# Patient Record
Sex: Female | Born: 1960 | Race: White | Hispanic: No | Smoking: Current every day smoker
Health system: Southern US, Community
[De-identification: ages and names within clinical notes are randomized; demographics above are authoritative.]

## PROBLEM LIST (undated history)

## (undated) DIAGNOSIS — F32A Depression, unspecified: Secondary | ICD-10-CM

## (undated) DIAGNOSIS — F329 Major depressive disorder, single episode, unspecified: Secondary | ICD-10-CM

## (undated) DIAGNOSIS — F1910 Other psychoactive substance abuse, uncomplicated: Secondary | ICD-10-CM

## (undated) HISTORY — PX: APPENDECTOMY: SHX54

## (undated) HISTORY — PX: TUBAL LIGATION: SHX77

---

## 2010-02-24 ENCOUNTER — Emergency Department (HOSPITAL_BASED_OUTPATIENT_CLINIC_OR_DEPARTMENT_OTHER)
Admission: EM | Admit: 2010-02-24 | Discharge: 2010-02-25 | Payer: Self-pay | Source: Home / Self Care | Admitting: Emergency Medicine

## 2010-05-10 LAB — CBC
MCHC: 34 g/dL (ref 30.0–36.0)
MCV: 91.4 fL (ref 78.0–100.0)
Platelets: 228 10*3/uL (ref 150–400)
WBC: 6.2 10*3/uL (ref 4.0–10.5)

## 2010-05-10 LAB — DIFFERENTIAL
Basophils Absolute: 0 10*3/uL (ref 0.0–0.1)
Eosinophils Absolute: 0.4 10*3/uL (ref 0.0–0.7)
Lymphs Abs: 2.1 10*3/uL (ref 0.7–4.0)
Monocytes Absolute: 0.5 10*3/uL (ref 0.1–1.0)
Neutro Abs: 3.2 10*3/uL (ref 1.7–7.7)
Neutrophils Relative %: 52 % (ref 43–77)

## 2010-05-10 LAB — BASIC METABOLIC PANEL
Chloride: 105 mEq/L (ref 96–112)
GFR calc Af Amer: >60 mL/min (ref >60)
GFR calc non Af Amer: >60 mL/min (ref >60)
Potassium: 3.8 mEq/L (ref 3.5–5.1)
Sodium: 141 mEq/L (ref 135–145)

## 2012-04-20 ENCOUNTER — Encounter (HOSPITAL_BASED_OUTPATIENT_CLINIC_OR_DEPARTMENT_OTHER): Payer: Self-pay | Admitting: *Deleted

## 2012-04-20 ENCOUNTER — Emergency Department (HOSPITAL_BASED_OUTPATIENT_CLINIC_OR_DEPARTMENT_OTHER)
Admission: EM | Admit: 2012-04-20 | Discharge: 2012-04-20 | Disposition: A | Discharge status: Home / Self Care | Payer: Self-pay | Attending: Emergency Medicine | Admitting: Emergency Medicine

## 2012-04-20 DIAGNOSIS — Z79899 Other long term (current) drug therapy: Secondary | ICD-10-CM | POA: Insufficient documentation

## 2012-04-20 DIAGNOSIS — F329 Major depressive disorder, single episode, unspecified: Secondary | ICD-10-CM | POA: Insufficient documentation

## 2012-04-20 DIAGNOSIS — F1910 Other psychoactive substance abuse, uncomplicated: Secondary | ICD-10-CM | POA: Insufficient documentation

## 2012-04-20 DIAGNOSIS — K029 Dental caries, unspecified: Secondary | ICD-10-CM | POA: Insufficient documentation

## 2012-04-20 DIAGNOSIS — F17200 Nicotine dependence, unspecified, uncomplicated: Secondary | ICD-10-CM | POA: Insufficient documentation

## 2012-04-20 DIAGNOSIS — F3289 Other specified depressive episodes: Secondary | ICD-10-CM | POA: Insufficient documentation

## 2012-04-20 HISTORY — DX: Other psychoactive substance abuse, uncomplicated: F19.10

## 2012-04-20 HISTORY — DX: Depression, unspecified: F32.A

## 2012-04-20 HISTORY — DX: Major depressive disorder, single episode, unspecified: F32.9

## 2012-04-20 MED ORDER — AMOXICILLIN 500 MG PO CAPS: 1000.0000 mg | ORAL_CAPSULE | Freq: Two times a day (BID) | ORAL | Status: DC

## 2012-04-20 NOTE — ED Provider Notes (Signed)
History     CSN: [id number]  Arrival date & time 04/20/12  4098   None     Chief Complaint  Patient presents with  . Dental Pain    (Consider location/radiation/quality/duration/timing/severity/associated sxs/prior treatment) Patient is a 52 y.o. female presenting with tooth pain. The history is provided by the patient.  Dental Pain She has been having pain in her left upper teeth for the last 2 months. Pain is intermittent and only moderate in intensity. It is 4/5 at its worst and she is currently pain free. She is under treatment at [facility] For cocaine and alcohol abuse. She states that they will be arranging for a dentist to see her.  Past Medical History  Diagnosis Date  . Substance abuse   . Depression     History reviewed. No pertinent past surgical history.  History reviewed. No pertinent family history.  History  Substance Use Topics  . Smoking status: Current Every Day Smoker  . Smokeless tobacco: Not on file  . Alcohol Use: Not on file    OB History   Grav Para Term Preterm Abortions TAB SAB Ect Mult Living                  Review of Systems  All other systems reviewed and are negative.    Allergies  Review of patient's allergies indicates no known allergies.  Home Medications   Current Outpatient Rx  Name  Route  Sig  Dispense  Refill  . ARIPiprazole (ABILIFY) 10 MG tablet   Oral   Take 10 mg by mouth daily.         . sertraline (ZOLOFT) 50 MG tablet   Oral   Take 100 mg by mouth 2 (two) times daily.           BP 127/78  Pulse 89  Temp(Src) 98 F (36.7 C) (Oral)  Resp 14  Ht 5' 4.5" (1.638 m)  Wt 153 lb (69.4 kg)  BMI 25.87 kg/m2  SpO2 99%  Physical Exam  Nursing note and vitals reviewed.  52 year old female, resting comfortably and in no acute distress. Vital signs are normal. Oxygen saturation is 99%, which is normal. Head is normocephalic and atraumatic. PERRLA, EOMI. Oropharynx is clear.Teeth numbers 16 and 17 are very  carious and extremely loose. Multiple other teeth are missing.  Neck is nontender and supple without adenopathy or JVD. Back is nontender and there is no CVA tenderness. Lungs are clear without rales, wheezes, or rhonchi. Chest is nontender. Heart has regular rate and rhythm without murmur. Abdomen is soft, flat, nontender without masses or hepatosplenomegaly and peristalsis is normoactive. Extremities have no cyanosis or edema, full range of motion is present. Skin is warm and dry without rash. Neurologic: Mental status is normal, cranial nerves are intact, there are no motor or sensory deficits.  ED Course  Procedures (including critical care time)   1. Dental caries       MDM  Extensive caries in teeth numbers 16 and 17 with marked loosening of these teeth. I doubt if they are viable. She needs to see a dentist and she states that Floydene Flock is going to arrange for a dentist. In the meantime, she is discharged with a prescription for amoxicillin.        Dione Booze, MD 04/20/12 1036

## 2012-04-20 NOTE — ED Notes (Signed)
Pt amb to room 10 with quick steady gait smiling in nad. Pt reports tooth pain x 2 months. Sent here by [facility] for antibiotics, pt states they will then arrange for her to see a dentist. Pt denies any pain at this time, states she does have pain off and on.

## 2012-04-22 ENCOUNTER — Emergency Department (HOSPITAL_BASED_OUTPATIENT_CLINIC_OR_DEPARTMENT_OTHER): Payer: Self-pay

## 2012-04-22 ENCOUNTER — Observation Stay (HOSPITAL_BASED_OUTPATIENT_CLINIC_OR_DEPARTMENT_OTHER)
Admission: EM | Admit: 2012-04-22 | Discharge: 2012-04-23 | Payer: Self-pay | Attending: General Surgery | Admitting: General Surgery

## 2012-04-22 ENCOUNTER — Emergency Department (HOSPITAL_COMMUNITY): Payer: Self-pay | Admitting: *Deleted

## 2012-04-22 ENCOUNTER — Encounter (HOSPITAL_COMMUNITY): Payer: Self-pay | Admitting: *Deleted

## 2012-04-22 ENCOUNTER — Encounter (HOSPITAL_BASED_OUTPATIENT_CLINIC_OR_DEPARTMENT_OTHER): Payer: Self-pay | Admitting: *Deleted

## 2012-04-22 ENCOUNTER — Encounter (HOSPITAL_COMMUNITY)
Admission: EM | Disposition: A | Discharge status: Other Acute Inpatient Hospital | Payer: Self-pay | Source: Home / Self Care | Attending: Emergency Medicine

## 2012-04-22 DIAGNOSIS — A5901 Trichomonal vulvovaginitis: Secondary | ICD-10-CM | POA: Insufficient documentation

## 2012-04-22 DIAGNOSIS — F1910 Other psychoactive substance abuse, uncomplicated: Secondary | ICD-10-CM | POA: Diagnosis present

## 2012-04-22 DIAGNOSIS — F1010 Alcohol abuse, uncomplicated: Secondary | ICD-10-CM | POA: Insufficient documentation

## 2012-04-22 DIAGNOSIS — F1210 Cannabis abuse, uncomplicated: Secondary | ICD-10-CM | POA: Insufficient documentation

## 2012-04-22 DIAGNOSIS — K3580 Unspecified acute appendicitis: Principal | ICD-10-CM | POA: Diagnosis present

## 2012-04-22 DIAGNOSIS — J069 Acute upper respiratory infection, unspecified: Secondary | ICD-10-CM | POA: Insufficient documentation

## 2012-04-22 DIAGNOSIS — F17200 Nicotine dependence, unspecified, uncomplicated: Secondary | ICD-10-CM | POA: Insufficient documentation

## 2012-04-22 DIAGNOSIS — Z79899 Other long term (current) drug therapy: Secondary | ICD-10-CM | POA: Insufficient documentation

## 2012-04-22 HISTORY — PX: LAPAROSCOPIC APPENDECTOMY: SHX408

## 2012-04-22 LAB — COMPREHENSIVE METABOLIC PANEL
ALT: 26 U/L (ref 0–35)
AST: 24 U/L (ref 0–37)
Albumin: 3.9 g/dL (ref 3.5–5.2)
Alkaline Phosphatase: 117 U/L (ref 39–117)
CO2: 29 mEq/L (ref 19–32)
Creatinine, Ser: 0.6 mg/dL (ref 0.50–1.10)
GFR calc non Af Amer: >90 mL/min (ref >90)
Total Bilirubin: 0.3 mg/dL (ref 0.3–1.2)
Total Protein: 7.6 g/dL (ref 6.0–8.3)

## 2012-04-22 LAB — CBC WITH DIFFERENTIAL/PLATELET
Basophils Relative: 0 % (ref 0–1)
Eosinophils Absolute: 0.3 10*3/uL (ref 0.0–0.7)
Hemoglobin: 14 g/dL (ref 12.0–15.0)
Lymphocytes Relative: 12 % (ref 12–46)
Lymphs Abs: 1.3 10*3/uL (ref 0.7–4.0)
MCH: 31.4 pg (ref 26.0–34.0)
MCHC: 33.6 g/dL (ref 30.0–36.0)
Monocytes Absolute: 0.9 10*3/uL (ref 0.1–1.0)
Neutro Abs: 8.6 10*3/uL — ABNORMAL HIGH (ref 1.7–7.7)
Platelets: 286 10*3/uL (ref 150–400)

## 2012-04-22 LAB — URINALYSIS, ROUTINE W REFLEX MICROSCOPIC
Bilirubin Urine: NEGATIVE
Hgb urine dipstick: NEGATIVE
Ketones, ur: NEGATIVE mg/dL

## 2012-04-22 LAB — URINE MICROSCOPIC-ADD ON

## 2012-04-22 SURGERY — APPENDECTOMY, LAPAROSCOPIC
Anesthesia: General | Site: Abdomen | Wound class: Clean Contaminated

## 2012-04-22 MED ORDER — ONDANSETRON HCL 4 MG PO TABS: 4.0000 mg | ORAL_TABLET | Freq: Four times a day (QID) | ORAL | Status: DC | PRN

## 2012-04-22 MED ORDER — MORPHINE SULFATE 2 MG/ML IJ SOLN: 2.0000 mg | INTRAMUSCULAR | Status: DC | PRN

## 2012-04-22 MED ORDER — LIDOCAINE HCL (CARDIAC) 20 MG/ML IV SOLN
INTRAVENOUS | Status: DC | PRN
  Administered 2012-04-22: 75 mg via INTRAVENOUS

## 2012-04-22 MED ORDER — SUCCINYLCHOLINE CHLORIDE 20 MG/ML IJ SOLN
INTRAMUSCULAR | Status: DC | PRN
  Administered 2012-04-22: 100 mg via INTRAVENOUS

## 2012-04-22 MED ORDER — ALBUTEROL SULFATE HFA 108 (90 BASE) MCG/ACT IN AERS
INHALATION_SPRAY | RESPIRATORY_TRACT | Status: AC
  Filled 2012-04-22: qty 6.7

## 2012-04-22 MED ORDER — MIDAZOLAM HCL 5 MG/5ML IJ SOLN
INTRAMUSCULAR | Status: DC | PRN
  Administered 2012-04-22: 0.5 mg via INTRAVENOUS
  Administered 2012-04-22: .5 mg via INTRAVENOUS
  Administered 2012-04-22: 1 mg via INTRAVENOUS

## 2012-04-22 MED ORDER — ACETAMINOPHEN 10 MG/ML IV SOLN
INTRAVENOUS | Status: AC
  Filled 2012-04-22: qty 100

## 2012-04-22 MED ORDER — SERTRALINE HCL 100 MG PO TABS
200.0000 mg | ORAL_TABLET | Freq: Every day | ORAL | Status: DC
  Administered 2012-04-22 – 2012-04-23 (×2): 200 mg via ORAL
  Filled 2012-04-22 (×2): qty 2

## 2012-04-22 MED ORDER — FENTANYL CITRATE 0.05 MG/ML IJ SOLN
INTRAMUSCULAR | Status: DC | PRN
  Administered 2012-04-22: 100 ug via INTRAVENOUS
  Administered 2012-04-22: 150 ug via INTRAVENOUS

## 2012-04-22 MED ORDER — ESMOLOL HCL 10 MG/ML IV SOLN
INTRAVENOUS | Status: DC | PRN
  Administered 2012-04-22: 20 mg via INTRAVENOUS
  Administered 2012-04-22: 10 mg via INTRAVENOUS

## 2012-04-22 MED ORDER — SODIUM CHLORIDE 0.9 % IV BOLUS (SEPSIS)
1000.0000 mL | Freq: Once | INTRAVENOUS | Status: AC
  Administered 2012-04-22: 1000 mL via INTRAVENOUS

## 2012-04-22 MED ORDER — NEOSTIGMINE METHYLSULFATE 1 MG/ML IJ SOLN
INTRAMUSCULAR | Status: DC | PRN
  Administered 2012-04-22: 1 mg via INTRAVENOUS

## 2012-04-22 MED ORDER — INFLUENZA VIRUS VACC SPLIT PF IM SUSP
0.5000 mL | INTRAMUSCULAR | Status: DC
  Filled 2012-04-22 (×2): qty 0.5

## 2012-04-22 MED ORDER — LACTATED RINGERS IV SOLN
INTRAVENOUS | Status: DC | PRN
  Administered 2012-04-22 (×2): via INTRAVENOUS

## 2012-04-22 MED ORDER — GUAIFENESIN 100 MG/5ML PO SOLN
200.0000 mg | Freq: Three times a day (TID) | ORAL | Status: DC | PRN
  Filled 2012-04-22: qty 118

## 2012-04-22 MED ORDER — PHENYLEPHRINE HCL 10 MG/ML IJ SOLN
INTRAMUSCULAR | Status: DC | PRN
  Administered 2012-04-22: 20 ug via INTRAVENOUS

## 2012-04-22 MED ORDER — KETOROLAC TROMETHAMINE 30 MG/ML IJ SOLN
INTRAMUSCULAR | Status: DC | PRN
  Administered 2012-04-22: 30 mg via INTRAVENOUS

## 2012-04-22 MED ORDER — METRONIDAZOLE 500 MG PO TABS
500.0000 mg | ORAL_TABLET | Freq: Three times a day (TID) | ORAL | Status: DC
  Administered 2012-04-22 – 2012-04-23 (×2): 500 mg via ORAL
  Filled 2012-04-22 (×7): qty 1

## 2012-04-22 MED ORDER — SODIUM CHLORIDE 0.9 % IV SOLN
3.0000 g | INTRAVENOUS | Status: DC | PRN
  Administered 2012-04-22: 3 g via INTRAVENOUS

## 2012-04-22 MED ORDER — LACTATED RINGERS IR SOLN
Status: DC | PRN
  Administered 2012-04-22: 1

## 2012-04-22 MED ORDER — PROPOFOL 10 MG/ML IV EMUL
INTRAVENOUS | Status: DC | PRN
  Administered 2012-04-22: 175 mg via INTRAVENOUS

## 2012-04-22 MED ORDER — IBUPROFEN 600 MG PO TABS
600.0000 mg | ORAL_TABLET | Freq: Four times a day (QID) | ORAL | Status: DC | PRN
  Administered 2012-04-22 – 2012-04-23 (×2): 600 mg via ORAL
  Filled 2012-04-22 (×2): qty 1

## 2012-04-22 MED ORDER — IOHEXOL 300 MG/ML  SOLN
50.0000 mL | Freq: Once | INTRAMUSCULAR | Status: AC | PRN
  Administered 2012-04-22: 50 mL via ORAL

## 2012-04-22 MED ORDER — LACTATED RINGERS IV SOLN: INTRAVENOUS | Status: DC

## 2012-04-22 MED ORDER — HYDROCODONE-ACETAMINOPHEN 5-325 MG PO TABS: 1.0000 | ORAL_TABLET | ORAL | Status: DC | PRN

## 2012-04-22 MED ORDER — DM-GUAIFENESIN ER 30-600 MG PO TB12
1.0000 | ORAL_TABLET | Freq: Two times a day (BID) | ORAL | Status: DC
  Administered 2012-04-22 – 2012-04-23 (×3): 1 via ORAL
  Filled 2012-04-22 (×4): qty 1

## 2012-04-22 MED ORDER — ONDANSETRON HCL 4 MG/2ML IJ SOLN: 4.0000 mg | Freq: Four times a day (QID) | INTRAMUSCULAR | Status: DC | PRN

## 2012-04-22 MED ORDER — PROMETHAZINE HCL 25 MG/ML IJ SOLN: 6.2500 mg | INTRAMUSCULAR | Status: DC | PRN

## 2012-04-22 MED ORDER — ONDANSETRON HCL 4 MG/2ML IJ SOLN
INTRAMUSCULAR | Status: DC | PRN
  Administered 2012-04-22 (×2): 2 mg via INTRAVENOUS

## 2012-04-22 MED ORDER — ACETAMINOPHEN 10 MG/ML IV SOLN
INTRAVENOUS | Status: DC | PRN
  Administered 2012-04-22: 1000 mg via INTRAVENOUS

## 2012-04-22 MED ORDER — PNEUMOCOCCAL VAC POLYVALENT 25 MCG/0.5ML IJ INJ
0.5000 mL | INJECTION | INTRAMUSCULAR | Status: DC
  Filled 2012-04-22 (×2): qty 0.5

## 2012-04-22 MED ORDER — SODIUM CHLORIDE 0.9 % IV SOLN
3.0000 g | Freq: Once | INTRAVENOUS | Status: AC
  Administered 2012-04-22: 3 g via INTRAVENOUS
  Filled 2012-04-22: qty 3

## 2012-04-22 MED ORDER — BUPIVACAINE HCL (PF) 0.5 % IJ SOLN
INTRAMUSCULAR | Status: AC
  Filled 2012-04-22: qty 30

## 2012-04-22 MED ORDER — HYDROMORPHONE HCL PF 1 MG/ML IJ SOLN: 0.2500 mg | INTRAMUSCULAR | Status: DC | PRN

## 2012-04-22 MED ORDER — IOHEXOL 300 MG/ML  SOLN
100.0000 mL | Freq: Once | INTRAMUSCULAR | Status: AC | PRN
  Administered 2012-04-22: 100 mL via INTRAVENOUS

## 2012-04-22 MED ORDER — DEXAMETHASONE SODIUM PHOSPHATE 10 MG/ML IJ SOLN
INTRAMUSCULAR | Status: DC | PRN
  Administered 2012-04-22: 10 mg via INTRAVENOUS

## 2012-04-22 MED ORDER — SODIUM CHLORIDE 0.9 % IV SOLN
3.0000 g | Freq: Four times a day (QID) | INTRAVENOUS | Status: AC
  Administered 2012-04-22 – 2012-04-23 (×3): 3 g via INTRAVENOUS
  Filled 2012-04-22 (×3): qty 3

## 2012-04-22 MED ORDER — CISATRACURIUM BESYLATE (PF) 10 MG/5ML IV SOLN
INTRAVENOUS | Status: DC | PRN
  Administered 2012-04-22: 6 mg via INTRAVENOUS

## 2012-04-22 MED ORDER — EPHEDRINE SULFATE 50 MG/ML IJ SOLN
INTRAMUSCULAR | Status: DC | PRN
  Administered 2012-04-22: 5 mg via INTRAVENOUS

## 2012-04-22 MED ORDER — KCL-LACTATED RINGERS-D5W 20 MEQ/L IV SOLN
INTRAVENOUS | Status: DC
  Administered 2012-04-22: 22:00:00 via INTRAVENOUS
  Administered 2012-04-22: 125 mL/h via INTRAVENOUS
  Filled 2012-04-22 (×8): qty 1000

## 2012-04-22 MED ORDER — GLYCOPYRROLATE 0.2 MG/ML IJ SOLN
INTRAMUSCULAR | Status: DC | PRN
  Administered 2012-04-22: 0.2 mg via INTRAVENOUS

## 2012-04-22 MED ORDER — ARIPIPRAZOLE 10 MG PO TABS
10.0000 mg | ORAL_TABLET | Freq: Every day | ORAL | Status: DC
  Administered 2012-04-22 – 2012-04-23 (×2): 10 mg via ORAL
  Filled 2012-04-22 (×2): qty 1

## 2012-04-22 MED ORDER — BUPIVACAINE HCL (PF) 0.5 % IJ SOLN
INTRAMUSCULAR | Status: DC | PRN
  Administered 2012-04-22: 15 mL

## 2012-04-22 SURGICAL SUPPLY — 44 items
APPLIER CLIP 5 13 M/L LIGAMAX5 (MISCELLANEOUS) ×2
APPLIER CLIP ROT 10 11.4 M/L (STAPLE)
BENZOIN TINCTURE PRP APPL 2/3 (GAUZE/BANDAGES/DRESSINGS) ×2 IMPLANT
CANISTER SUCTION 2500CC (MISCELLANEOUS) ×2 IMPLANT
CHLORAPREP W/TINT 26ML (MISCELLANEOUS) ×2 IMPLANT
CLIP APPLIE 5 13 M/L LIGAMAX5 (MISCELLANEOUS) ×1 IMPLANT
CLIP APPLIE ROT 10 11.4 M/L (STAPLE) IMPLANT
CLOTH BEACON ORANGE TIMEOUT ST (SAFETY) ×2 IMPLANT
CLSR STERI-STRIP ANTIMIC 1/2X4 (GAUZE/BANDAGES/DRESSINGS) ×2 IMPLANT
CUTTER FLEX LINEAR 45M (STAPLE) ×2 IMPLANT
DECANTER SPIKE VIAL GLASS SM (MISCELLANEOUS) ×2 IMPLANT
DRAPE LAPAROSCOPIC ABDOMINAL (DRAPES) ×2 IMPLANT
DRAPE UTILITY XL STRL (DRAPES) ×2 IMPLANT
DRSG TEGADERM 2-3/8X2-3/4 SM (GAUZE/BANDAGES/DRESSINGS) ×2 IMPLANT
DRSG TEGADERM 4X4.75 (GAUZE/BANDAGES/DRESSINGS) ×2 IMPLANT
ELECT REM PT RETURN 9FT ADLT (ELECTROSURGICAL) ×2
ELECTRODE REM PT RTRN 9FT ADLT (ELECTROSURGICAL) ×1 IMPLANT
ENDOLOOP SUT PDS II  0 18 (SUTURE)
ENDOLOOP SUT PDS II 0 18 (SUTURE) IMPLANT
GAUZE SPONGE 2X2 8PLY STRL LF (GAUZE/BANDAGES/DRESSINGS) ×1 IMPLANT
GLOVE BIOGEL PI IND STRL 7.0 (GLOVE) ×1 IMPLANT
GLOVE BIOGEL PI INDICATOR 7.0 (GLOVE) ×1
GLOVE ECLIPSE 8.0 STRL XLNG CF (GLOVE) ×2 IMPLANT
GLOVE INDICATOR 8.0 STRL GRN (GLOVE) ×4 IMPLANT
GOWN STRL NON-REIN LRG LVL3 (GOWN DISPOSABLE) ×2 IMPLANT
GOWN STRL REIN XL XLG (GOWN DISPOSABLE) ×4 IMPLANT
KIT BASIN OR (CUSTOM PROCEDURE TRAY) ×2 IMPLANT
PENCIL BUTTON HOLSTER BLD 10FT (ELECTRODE) ×2 IMPLANT
POUCH SPECIMEN RETRIEVAL 10MM (ENDOMECHANICALS) ×2 IMPLANT
RELOAD 45 VASCULAR/THIN (ENDOMECHANICALS) IMPLANT
RELOAD STAPLE TA45 3.5 REG BLU (ENDOMECHANICALS) ×2 IMPLANT
SCALPEL HARMONIC ACE (MISCELLANEOUS) ×2 IMPLANT
SET IRRIG TUBING LAPAROSCOPIC (IRRIGATION / IRRIGATOR) ×2 IMPLANT
SOLUTION ANTI FOG 6CC (MISCELLANEOUS) ×2 IMPLANT
SPONGE GAUZE 2X2 STER 10/PKG (GAUZE/BANDAGES/DRESSINGS) ×1
STRIP CLOSURE SKIN 1/2X4 (GAUZE/BANDAGES/DRESSINGS) ×2 IMPLANT
SUT MNCRL AB 4-0 PS2 18 (SUTURE) ×2 IMPLANT
TOWEL OR 17X26 10 PK STRL BLUE (TOWEL DISPOSABLE) ×2 IMPLANT
TOWEL OR NON WOVEN STRL DISP B (DISPOSABLE) ×2 IMPLANT
TRAY FOLEY CATH 14FRSI W/METER (CATHETERS) ×2 IMPLANT
TRAY LAP CHOLE (CUSTOM PROCEDURE TRAY) ×2 IMPLANT
TROCAR BLADELESS OPT 5 75 (ENDOMECHANICALS) ×4 IMPLANT
TROCAR XCEL BLUNT TIP 100MML (ENDOMECHANICALS) ×2 IMPLANT
TUBING INSUFFLATION 10FT LAP (TUBING) ×2 IMPLANT

## 2012-04-22 NOTE — Op Note (Signed)
Operative Note  Laurie Browning female 52 y.o. 04/22/2012  PREOPERATIVE DX:  Acute appendicitis  POSTOPERATIVE DX:  Same(Retrocecal)  PROCEDURE:  Laparoscopic appendectomy         Surgeon: Adolph Pollack   Assistants: None  Anesthesia: General endotracheal anesthesia  Indications:   This is a 52 year old female in drug and alcohol rehabilitation who developed right-sided abdominal pain that worsened and persisted. She was evaluated and found to have acute appendicitis. She is now brought to the operating room for appendectomy.    Procedure Detail:  She was brought to the operating room placed supine on the operating table and general anesthetic was given. A Foley catheter was inserted. Some of the hair in the pubic area was clipped. The abdominal wall and upper pelvis areas were sterilely prepped and draped.  Marcaine solution was infiltrated in the subcutaneous tissues in the subumbilical region. A small subumbilical incision was made through the skin, subcutaneous tissue, fascia, and peritoneum entering the peritoneal cavity under direct vision. A pursestring suture of 0 Vicryl was placed around the edges of the fascia. A Hassan trocar was introduced into the peritoneal cavity and a pneumoperitoneum was created by insufflation of CO2 gas. The laparoscope was introduced in the area underneath the trocar was inspected. There was no evidence of organ injury or bleeding.  A 5 mm trocar was placed in the left lower quadrant. A 5 mm trocar was placed in the right upper quadrant. She was placed in the Trendelenburg position with right side tilted slightly up. The small bowel was mobilized free from the right lower quadrant. An acutely inflamed retrocecal appendicitis was noted. There is no evidence of abscess, necrosis, or perforation. The appendix was then mobilized using blunt dissection and the harmonic scalpel. It was then able to be retracted anteriorly. The mesoappendix was divided with  the harmonic scalpel down to the base.Using the Endo GIA stapler, the appendix and a small cuff of cecum were removed.  The specimen was then placed in a retrieval bag and removed through the subumbilical port. The subumbilical trocar was replaced.  The area was then copiously irrigated with saline solution. The staple line was inspected and there is no evidence of bleeding or leak. The irrigation fluid was evacuated. The subumbilical trocar was removed after a four-quadrant inspection. The subumbilical fascial defect was closed under laparoscopic vision by tying down the purse string suture. The remaining trocars were removed and the CO2 gas released.  Skin incisions were closed with 4-0 Monocryl subcuticular stitches. Steri-Strips and sterile dressings were applied.  She tolerated the procedure well without any apparent complications and was taken to the recovery room in satisfactory condition.  Estimated Blood Loss:  100 ml         Drains: none  Blood Given: none          Specimens: Appendix        Complications:  * No complications entered in OR log *         Disposition: PACU - hemodynamically stable.         Condition: stable

## 2012-04-22 NOTE — Transfer of Care (Signed)
Immediate Anesthesia Transfer of Care Note  Patient: Laurie Browning  Procedure(s) Performed: Procedure(s): APPENDECTOMY LAPAROSCOPIC (N/A)  Patient Location: PACU  Anesthesia Type:General  Level of Consciousness: awake, patient cooperative, confused, lethargic and responds to stimulation  Airway & Oxygen Therapy: Patient Spontanous Breathing and Patient connected to face mask oxygen  Post-op Assessment: Report given to PACU RN, Post -op Vital signs reviewed and stable and Patient moving all extremities  Post vital signs: Reviewed and stable  Complications: No apparent anesthesia complications

## 2012-04-22 NOTE — ED Notes (Signed)
Patient arrived via carelink. Per Care link yesterday patient had right upper quad abd pain. Took ibuprofen last night which helped. Pain was still there this am. Laurie [facility]. CT showed appendicitis. Tender to palpation.  141/82, 78, 16, 98.1 Hx of drug abuse.

## 2012-04-22 NOTE — ED Provider Notes (Signed)
History     CSN: [id number]  Arrival date & time 04/22/12  0808   First MD Initiated Contact with Patient 04/22/12 [phone]      Chief Complaint  Patient presents with  . Abdominal Pain    (Consider location/radiation/quality/duration/timing/severity/associated sxs/prior treatment) HPI Comments: Patient in treatment at [facility].  Started yesterday afternoon with discomfort in the right lower abdomen.  She has had an ovarian cyst in the past and this feels similar.  No fevers or chills.  Patient is a 52 y.o. female presenting with abdominal pain. The history is provided by the patient.  Abdominal Pain Pain location:  RLQ Pain quality: stabbing   Pain radiates to:  Does not radiate Pain severity:  Moderate Onset quality:  Gradual Timing:  Constant Progression:  Worsening Chronicity:  New Context: not sick contacts, not suspicious food intake and not trauma   Relieved by:  Nothing Worsened by:  Movement and palpation Ineffective treatments:  None tried Associated symptoms: nausea   Associated symptoms: no hematuria, no vaginal bleeding and no vaginal discharge     Past Medical History  Diagnosis Date  . Substance abuse   . Depression     Past Surgical History  Procedure Laterality Date  . Tubal ligation      No family history on file.  History  Substance Use Topics  . Smoking status: Current Every Day Smoker  . Smokeless tobacco: Not on file  . Alcohol Use: No    OB History   Grav Para Term Preterm Abortions TAB SAB Ect Mult Living                  Review of Systems  Gastrointestinal: Positive for nausea and abdominal pain.  Genitourinary: Negative for hematuria, vaginal bleeding and vaginal discharge.  All other systems reviewed and are negative.    Allergies  Review of patient's allergies indicates no known allergies.  Home Medications   Current Outpatient Rx  Name  Route  Sig  Dispense  Refill  . amoxicillin (AMOXIL) 500 MG capsule   Oral   Take  2 capsules (1,000 mg total) by mouth 2 (two) times daily.   40 capsule   0   . ARIPiprazole (ABILIFY) 10 MG tablet   Oral   Take 10 mg by mouth daily.         . sertraline (ZOLOFT) 50 MG tablet   Oral   Take 100 mg by mouth 2 (two) times daily.           BP 139/89  Pulse 81  Temp(Src) 98.3 F (36.8 C) (Oral)  Resp 18  Ht 5\' 4"  (1.626 m)  Wt 153 lb (69.4 kg)  BMI 26.25 kg/m2  SpO2 99%  Physical Exam  Nursing note and vitals reviewed. Constitutional: She is oriented to person, place, and time. She appears well-developed and well-nourished. No distress.  HENT:  Head: Normocephalic and atraumatic.  Neck: Normal range of motion. Neck supple.  Cardiovascular: Normal rate and regular rhythm.  Exam reveals no gallop and no friction rub.   No murmur heard. Pulmonary/Chest: Effort normal and breath sounds normal. No respiratory distress. She has no wheezes.  Abdominal: Soft. Bowel sounds are normal. She exhibits no distension.  There it ttp in the right lower quadrant without rebound or guarding.  Bowel sounds are present.    Musculoskeletal: Normal range of motion.  Neurological: She is alert and oriented to person, place, and time.  Skin: Skin is warm and dry. She  is not diaphoretic.    ED Course  Procedures (including critical care time)  Labs Reviewed  URINALYSIS, ROUTINE W REFLEX MICROSCOPIC   No results found.   No diagnosis found.    MDM  The wbc is 11k and the ct confirms acute appendicitis.  I have spoken with Dr. Purnell Shoemaker from [facility] who wants patient to have 3 g IV unasyn and transfer to the ED for surgical intervention.        Geoffery Lyons, MD 04/22/12 1024

## 2012-04-22 NOTE — Anesthesia Postprocedure Evaluation (Signed)
Anesthesia Post Note  Patient: Laurie Browning  Procedure(s) Performed: Procedure(s) (LRB): APPENDECTOMY LAPAROSCOPIC (N/A)  Anesthesia type: General  Patient location: PACU  Post pain: Pain level controlled  Post assessment: Post-op Vital signs reviewed  Last Vitals:  Filed Vitals:   04/22/12 1450  BP:   Pulse: 94  Temp:   Resp: 15    Post vital signs: Reviewed  Level of consciousness: sedated  Complications: No apparent anesthesia complications

## 2012-04-22 NOTE — Preoperative (Signed)
Beta Blockers   Reason not to administer Beta Blockers:Not Applicable 

## 2012-04-22 NOTE — ED Notes (Signed)
NWG:[id number]<ZH> Expected date:04/22/12<BR> Expected time:11:24 AM<BR> Means of arrival:<BR> Comments:<BR> [facility] transport

## 2012-04-22 NOTE — ED Notes (Signed)
Patient c/o R side abd pain since yesterday, took ibuprofen last night which provided relief, continues to hurt this morning, no meds taken today, no nausea/vomiting. Current has sinus drainage/coughing & on amoxicillin for tooth infection

## 2012-04-22 NOTE — Anesthesia Preprocedure Evaluation (Signed)
Anesthesia Evaluation  Patient identified by MRN, date of birth, ID band Patient awake    Reviewed: Allergy & Precautions, H&P , NPO status , Patient's Chart, lab work & pertinent test results  History of Anesthesia Complications Negative for: history of anesthetic complications  Airway Mallampati: II TM Distance: >3 FB Neck ROM: Full    Dental  (+) Poor Dentition, Chipped, Loose and Dental Advisory Given,    Pulmonary Current Smoker,  Recent URI + rhonchi   + wheezing      Cardiovascular negative cardio ROS  Rhythm:Regular Rate:Normal     Neuro/Psych negative neurological ROS  negative psych ROS   GI/Hepatic negative GI ROS, Neg liver ROS, (+)     substance abuse (Patient states she has been clean for 23 days)  alcohol use and cocaine use,   Endo/Other  negative endocrine ROS  Renal/GU negative Renal ROS  negative genitourinary   Musculoskeletal negative musculoskeletal ROS (+)   Abdominal   Peds negative pediatric ROS (+)  Hematology negative hematology ROS (+)   Anesthesia Other Findings Multiple loose and broken teeth  Reproductive/Obstetrics negative OB ROS                           Anesthesia Physical Anesthesia Plan  ASA: II and emergent  Anesthesia Plan: General   Post-op Pain Management:    Induction: Intravenous, Rapid sequence and Cricoid pressure planned  Airway Management Planned: Oral ETT  Additional Equipment:   Intra-op Plan:   Post-operative Plan: Extubation in OR  Informed Consent: I have reviewed the patients History and Physical, chart, labs and discussed the procedure including the risks, benefits and alternatives for the proposed anesthesia with the patient or authorized representative who has indicated his/her understanding and acceptance.   Dental advisory given  Plan Discussed with: CRNA  Anesthesia Plan Comments:         Anesthesia  Quick Evaluation

## 2012-04-22 NOTE — H&P (Addendum)
Laurie Browning is an 52 y.o. female.   Chief Complaint:   Acute appendicitis HPI: She is currently in substance abuse rehabilitation at [facility].  Yesterday at 4 PM, she developed right lower quadrant pain that felt like a severe gas cramps. It persisted. She had some chills. She subsequently was taken to the [facility] for evaluation. The exam findings and CT scan were consistent with acute appendicitis. She has been transferred to [facility] for further treatment.  Past Medical History  Diagnosis Date  . Substance abuse   . Depression     Past Surgical History  Procedure Laterality Date  . Tubal ligation      No family history on file. Social History:  reports that she has been smoking.  She does not have any smokeless tobacco history on file. She reports that she does not drink alcohol. Her drug history is not on file.  Allergies: No Known Allergies  Prior to Admission medications   Medication Sig Start Date End Date Taking? Authorizing Provider  amoxicillin (AMOXIL) 500 MG capsule Take 1,000 mg by mouth daily.   Yes Historical Provider, MD  ARIPiprazole (ABILIFY) 10 MG tablet Take 10 mg by mouth daily.   Yes Historical Provider, MD  dextromethorphan-guaiFENesin (MUCINEX DM) 30-600 MG per 12 hr tablet Take 1.5 tablets by mouth every 12 (twelve) hours.   Yes Historical Provider, MD  guaifenesin (ROBITUSSIN) 100 MG/5ML syrup Take 200 mg by mouth 3 (three) times daily as needed for cough.   Yes Historical Provider, MD  ibuprofen (ADVIL,MOTRIN) 200 MG tablet Take 800 mg by mouth every 6 (six) hours as needed for pain.   Yes Historical Provider, MD  sertraline (ZOLOFT) 100 MG tablet Take 200 mg by mouth daily.   Yes Historical Provider, MD      (Not in a hospital admission)  Results for orders placed during the hospital encounter of 04/22/12 (from the past 48 hour(s))  URINALYSIS, ROUTINE W REFLEX MICROSCOPIC     Status: Abnormal   Collection Time    04/22/12  8:16 AM       Result Value Range   Color, Urine YELLOW  YELLOW   APPearance CLEAR  CLEAR   Specific Gravity, Urine 1.024  1.005 - 1.030   pH 8.0  5.0 - 8.0   Glucose, UA >1000 (*) NEGATIVE mg/dL   Hgb urine dipstick NEGATIVE  NEGATIVE   Bilirubin Urine NEGATIVE  NEGATIVE   Ketones, ur NEGATIVE  NEGATIVE mg/dL   Protein, ur NEGATIVE  NEGATIVE mg/dL   Urobilinogen, UA 0.2  0.0 - 1.0 mg/dL   Nitrite NEGATIVE  NEGATIVE   Leukocytes, UA MODERATE (*) NEGATIVE  URINE MICROSCOPIC-ADD ON     Status: Abnormal   Collection Time    04/22/12  8:16 AM      Result Value Range   Squamous Epithelial / LPF RARE  RARE   WBC, UA 11-20  <3 WBC/hpf   RBC / HPF 0-2  <3 RBC/hpf   Bacteria, UA FEW (*) RARE   Urine-Other TRICHOMONAS PRESENT    CBC WITH DIFFERENTIAL     Status: Abnormal   Collection Time    04/22/12  9:00 AM      Result Value Range   WBC 11.1 (*) 4.0 - 10.5 K/uL   RBC 4.46  3.87 - 5.11 MIL/uL   Hemoglobin 14.0  12.0 - 15.0 g/dL   HCT 65.7  84.6 - 96.2 %   MCV 93.5  78.0 - 100.0 fL  MCH 31.4  26.0 - 34.0 pg   MCHC 33.6  30.0 - 36.0 g/dL   RDW 41.3  24.4 - 01.0 %   Platelets 286  150 - 400 K/uL   Neutrophils Relative 78 (*) 43 - 77 %   Neutro Abs 8.6 (*) 1.7 - 7.7 K/uL   Lymphocytes Relative 12  12 - 46 %   Lymphs Abs 1.3  0.7 - 4.0 K/uL   Monocytes Relative 8  3 - 12 %   Monocytes Absolute 0.9  0.1 - 1.0 K/uL   Eosinophils Relative 3  0 - 5 %   Eosinophils Absolute 0.3  0.0 - 0.7 K/uL   Basophils Relative 0  0 - 1 %   Basophils Absolute 0.0  0.0 - 0.1 K/uL  COMPREHENSIVE METABOLIC PANEL     Status: None   Collection Time    04/22/12  9:00 AM      Result Value Range   Sodium 137  135 - 145 mEq/L   Potassium 4.0  3.5 - 5.1 mEq/L   Chloride 99  96 - 112 mEq/L   CO2 29  19 - 32 mEq/L   Glucose, Bld 91  70 - 99 mg/dL   BUN 13  6 - 23 mg/dL   Creatinine, Ser 2.72  0.50 - 1.10 mg/dL   Calcium 9.6  8.4 - 53.6 mg/dL   Total Protein 7.6  6.0 - 8.3 g/dL   Albumin 3.9  3.5 - 5.2 g/dL   AST  24  0 - 37 U/L   ALT 26  0 - 35 U/L   Alkaline Phosphatase 117  39 - 117 U/L   Total Bilirubin 0.3  0.3 - 1.2 mg/dL   GFR calc non Af Amer >90  >90 mL/min   GFR calc Af Amer >90  >90 mL/min   Comment:            The eGFR has been calculated     using the CKD EPI equation.     This calculation has not been     validated in all clinical     situations.     eGFR's persistently     <90 mL/min signify     possible Chronic Kidney Disease.   Dg Chest 2 View  04/22/2012  *RADIOLOGY REPORT*  Clinical Data:  Preoperative respiratory exam for acute appendicitis.  CHEST - 2 VIEW  Comparison: None  Findings: The heart size and mediastinal contours are within normal limits.  Both lungs are clear.  The visualized skeletal structures are unremarkable.  IMPRESSION: No active disease.   Original Report Authenticated By: Irish Lack, M.D.    Ct Abdomen Pelvis W Contrast  04/22/2012  *RADIOLOGY REPORT*  Clinical Data: Right lower quadrant pain, rule out appendicitis  CT ABDOMEN AND PELVIS WITH CONTRAST  Technique:  Multidetector CT imaging of the abdomen and pelvis was performed following the standard protocol during bolus administration of intravenous contrast.  Contrast: 50mL OMNIPAQUE IOHEXOL 300 MG/ML  SOLN, OMNIPAQUE IOHEXOL 300 MG/ML  SOLN  Comparison: None.  Findings: Lung bases are unremarkable.  Sagittal images of the spine shows mild generative changes lumbar spine.  Small hiatal hernia is noted.  Enhanced liver is unremarkable.  No calcified gallstones are noted within gallbladder.  Atherosclerotic calcifications of the abdominal aorta and the iliac arteries are noted.  No aortic aneurysm.  The pancreas, spleen and adrenal glands are unremarkable.  Kidneys are symmetrical in size and enhancement.  No focal  renal mass.  No hydronephrosis or hydroureter.  Delayed renal images shows bilateral renal symmetrical excretion. Bilateral visualized proximal ureter is unremarkable.  Moderate stool  throughout the colon.  No small bowel obstruction.  There is abnormal thickened appendix with enhancement of the wall. There is thickening of the appendiceal wall.  There is stranding of the surrounding fat.  Findings are consistent with acute appendicitis.  The appendix measures 1.2 cm in diameter.  No appendiceal abscess is noted.  The uterus and adnexa are unremarkable.  The urinary bladder is unremarkable.  No destructive bony lesions are noted within pelvis.  No inguinal adenopathy.  IMPRESSION:  1. There is abnormal appendix in the right lower quadrant of the abdomen consistent with acute appendicitis.  No appendiceal abscess is noted. 2.  No small bowel obstruction. 3.  No hydronephrosis or hydroureter. 4.  Moderate stool throughout the colon.  Critical findings discussed with Dr. Judd Lien   Original Report Authenticated By: Natasha Mead, M.D.     Review of Systems  Constitutional: Positive for chills. Negative for fever.  HENT: Positive for congestion.   Respiratory: Shortness of breath: mild.   Cardiovascular: Negative.   Gastrointestinal: Positive for abdominal pain and constipation. Negative for nausea, vomiting and diarrhea.  Genitourinary: Negative for dysuria and hematuria.  Endo/Heme/Allergies: Negative.   Psychiatric/Behavioral: Positive for depression and substance abuse.    Blood pressure 139/89, pulse 81, temperature 98.3 F (36.8 C), temperature source Oral, resp. rate 18, height 5\' 4"  (1.626 m), weight 153 lb (69.4 kg), SpO2 99.00%. Physical Exam  Constitutional: She appears well-developed and well-nourished. No distress.  HENT:  Head: Normocephalic and atraumatic.  Eyes: EOM are normal. No scleral icterus.  Neck: Neck supple.  Cardiovascular: Normal rate and regular rhythm.   Respiratory: Effort normal. She has wheezes (Soft wheeze on the right).  GI: Soft. She exhibits no distension and no mass. There is tenderness (Right lower quadrant). There is guarding (Right lower  quadrant).  Musculoskeletal: She exhibits no edema and no tenderness.  Lymphadenopathy:    She has no cervical adenopathy.  Neurological: She is alert.  Skin: Skin is warm and dry.  Psychiatric: She has a normal mood and affect. Her behavior is normal.     Assessment/Plan 1. Acute appendicitis with no evidence of abscess or perforation  #2. Upper respiratory infection  #3. Substance abuse (alcohol and cocaine)-she had been clean for 15 years but then had a relapse and is currently in rehabilitation for this.  4. Trichomonas infection  Plan: Laparoscopic possible open appendectomy. She has received IV Unasyn.  Will need treatment for trichomonas infection with Flagyl postoperatively.  I have discussed the procedure and risks of appendectomy. The risks include but are not limited to bleeding, infection, wound problems, anesthesia, injury to intra-abdominal organs, possibility of postoperative ileus. She seems to understand and agrees with the plan.  Bryah Ocheltree J 04/22/2012, 11:37 AM

## 2012-04-23 ENCOUNTER — Encounter (HOSPITAL_COMMUNITY): Payer: Self-pay | Admitting: General Surgery

## 2012-04-23 MED ORDER — IBUPROFEN 200 MG PO TABS: 600.0000 mg | ORAL_TABLET | Freq: Four times a day (QID) | ORAL | Status: AC | PRN

## 2012-04-23 MED ORDER — METRONIDAZOLE 500 MG PO TABS: 500.0000 mg | ORAL_TABLET | Freq: Three times a day (TID) | ORAL | Status: AC

## 2012-04-23 NOTE — Care Management Note (Signed)
    Page 1 of 1   04/23/2012     10:18:49 AM   CARE MANAGEMENT NOTE 04/23/2012  Patient:  Laurie Browning, Laurie Browning   Account Number:  [id number]  Date Initiated:  04/23/2012  Documentation initiated by:  Lorenda Ishihara  Subjective/Objective Assessment:     Action/Plan:   CSW to assist patient to return to [facility]   Anticipated DC Date:  04/23/2012   Anticipated DC Plan:  IP REHAB FACILITY  In-house referral  Clinical Social Worker      DC Planning Services  CM consult      Choice offered to / List presented to:             Status of service:  Completed, signed off Medicare Important Message given?   (If response is "NO", the following Medicare IM given date fields will be blank) Date Medicare IM given:   Date Additional Medicare IM given:    Discharge Disposition:  HOME/SELF CARE  Per UR Regulation:  Reviewed for med. necessity/level of care/duration of stay  If discussed at Long Length of Stay Meetings, dates discussed:    Comments:

## 2012-04-23 NOTE — Progress Notes (Signed)
1 Day Post-Op  Subjective: Feels good.  Not having much pain.  Tolerating solid diet.  Objective: Vital signs in last 24 hours: Temp:  [97.7 F (36.5 C)-99.6 F (37.6 C)] 98 F (36.7 C) (02/24 0450) Pulse Rate:  [63-97] 83 (02/24 0450) Resp:  [13-18] 18 (02/24 0450) BP: (100-145)/(54-89) 100/58 mmHg (02/24 0450) SpO2:  [93 %-100 %] 95 % (02/24 0450) Weight:  [153 lb (69.4 kg)-154 lb (69.854 kg)] 154 lb (69.854 kg) (02/23 1511) Last BM Date: 04/21/12  Intake/Output from previous day: 02/23 0701 - 02/24 0700 In: 5540.4 [P.O.:1680; I.V.:2910.4; IV Piggyback:950] Out: 3500 [Urine:3500] Intake/Output this shift:    PE: General- In NAD Abdomen-soft, dressings dry  Lab Results:   Recent Labs  04/22/12 0900  WBC 11.1*  HGB 14.0  HCT 41.7  PLT 286   BMET  Recent Labs  04/22/12 0900  NA 137  K 4.0  CL 99  CO2 29  GLUCOSE 91  BUN 13  CREATININE 0.60  CALCIUM 9.6   PT/INR No results found for this basename: LABPROT, INR,  in the last 72 hours Comprehensive Metabolic Panel:    Component Value Date/Time   NA 137 04/22/2012 0900   K 4.0 04/22/2012 0900   CL 99 04/22/2012 0900   CO2 29 04/22/2012 0900   BUN 13 04/22/2012 0900   CREATININE 0.60 04/22/2012 0900   GLUCOSE 91 04/22/2012 0900   CALCIUM 9.6 04/22/2012 0900   AST 24 04/22/2012 0900   ALT 26 04/22/2012 0900   ALKPHOS 117 04/22/2012 0900   BILITOT 0.3 04/22/2012 0900   PROT 7.6 04/22/2012 0900   ALBUMIN 3.9 04/22/2012 0900     Studies/Results: Dg Chest 2 View  04/22/2012  *RADIOLOGY REPORT*  Clinical Data:  Preoperative respiratory exam for acute appendicitis.  CHEST - 2 VIEW  Comparison: None  Findings: The heart size and mediastinal contours are within normal limits.  Both lungs are clear.  The visualized skeletal structures are unremarkable.  IMPRESSION: No active disease.   Original Report Authenticated By: Irish Lack, M.D.    Ct Abdomen Pelvis W Contrast  04/22/2012  *RADIOLOGY REPORT*  Clinical Data:  Right lower quadrant pain, rule out appendicitis  CT ABDOMEN AND PELVIS WITH CONTRAST  Technique:  Multidetector CT imaging of the abdomen and pelvis was performed following the standard protocol during bolus administration of intravenous contrast.  Contrast: 50mL OMNIPAQUE IOHEXOL 300 MG/ML  SOLN, OMNIPAQUE IOHEXOL 300 MG/ML  SOLN  Comparison: None.  Findings: Lung bases are unremarkable.  Sagittal images of the spine shows mild generative changes lumbar spine.  Small hiatal hernia is noted.  Enhanced liver is unremarkable.  No calcified gallstones are noted within gallbladder.  Atherosclerotic calcifications of the abdominal aorta and the iliac arteries are noted.  No aortic aneurysm.  The pancreas, spleen and adrenal glands are unremarkable.  Kidneys are symmetrical in size and enhancement.  No focal renal mass.  No hydronephrosis or hydroureter.  Delayed renal images shows bilateral renal symmetrical excretion. Bilateral visualized proximal ureter is unremarkable.  Moderate stool throughout the colon.  No small bowel obstruction.  There is abnormal thickened appendix with enhancement of the wall. There is thickening of the appendiceal wall.  There is stranding of the surrounding fat.  Findings are consistent with acute appendicitis.  The appendix measures 1.2 cm in diameter.  No appendiceal abscess is noted.  The uterus and adnexa are unremarkable.  The urinary bladder is unremarkable.  No destructive bony lesions are noted  within pelvis.  No inguinal adenopathy.  IMPRESSION:  1. There is abnormal appendix in the right lower quadrant of the abdomen consistent with acute appendicitis.  No appendiceal abscess is noted. 2.  No small bowel obstruction. 3.  No hydronephrosis or hydroureter. 4.  Moderate stool throughout the colon.  Critical findings discussed with Dr. Judd Lien   Original Report Authenticated By: Natasha Mead, M.D.     Anti-infectives: Anti-infectives   Start     Dose/Rate Route Frequency Ordered  Stop   04/22/12 1800  Ampicillin-Sulbactam (UNASYN) 3 g in sodium chloride 0.9 % 100 mL IVPB     3 g 100 mL/hr over 60 Minutes Intravenous Every 6 hours 04/22/12 1520 04/23/12 0648   04/22/12 1430  metroNIDAZOLE (FLAGYL) tablet 500 mg     500 mg Oral 3 times per day 04/22/12 1409     04/22/12 1030  Ampicillin-Sulbactam (UNASYN) 3 g in sodium chloride 0.9 % 100 mL IVPB     3 g 100 mL/hr over 60 Minutes Intravenous  Once 04/22/12 1021 04/22/12 1146      Assessment Principal Problem:   Acute appendicitis post laparoscopic appendectomy 04/22/12-doing well. Active Problems:   Trichomonas vaginitis-on Flagyl   Substance abuse-alcohol and cocaine    LOS: 1 day   Plan: Discharge back to [facility] re[facility] center.  Instructions given.   Santi Troung J 04/23/2012

## 2012-04-23 NOTE — Progress Notes (Signed)
CSW consulted to assist with d/c planning. Pt is from [facility] . She was admitted 2/23 for observation and returned to [facility] today. CSW contacted [facility] for assistance with transport back to their facility.  Cori Razor LCSW [phone]

## 2012-04-24 LAB — URINE CULTURE: Culture: NO GROWTH

## 2012-04-25 NOTE — Discharge Summary (Signed)
Physician Discharge Summary  Patient ID: Laurie Browning MRN: [id number] DOB/AGE: 03/29/1960 52 y.o.  Admit date: 04/22/2012 Discharge date: 04/23/2012  Admission Diagnoses:  Acute Appendicitis  Discharge Diagnoses:  Principal Problem:   Appendicitis, acute Active Problems:   Trichomonas vaginitis   Substance abuse-alcohol and cocaine   Discharged Condition: good  Hospital Course: She was admitted and underwent a laparoscopic appendectomy on 04/22/12 and did well from this.  She was started on Flagyl postop for her Trichomonas infection.  She was able to be discharged back to [facility] rehabilitation center on 04/23/12.  Discharge instructions were given to her.  Consults: None  Significant Diagnostic Studies: none  Treatments: surgery: Laparoscopic appendectomy.  Discharge Exam: Blood pressure 100/58, pulse 83, temperature 98 F (36.7 C), temperature source Oral, resp. rate 18, height 5\' 4"  (1.626 m), weight 154 lb (69.854 kg), SpO2 95.00%.   Disposition: [facility]Another [facility] Care Institution Not Defined   Future Appointments Provider Department Dept Phone   05/08/2012 12:45 PM [facility], [place] [phone]       Medication List    TAKE these medications       amoxicillin 500 MG capsule  Commonly known as:  AMOXIL  Take 1,000 mg by mouth daily.     ARIPiprazole 10 MG tablet  Commonly known as:  ABILIFY  Take 10 mg by mouth daily.     dextromethorphan-guaiFENesin 30-600 MG per 12 hr tablet  Commonly known as:  MUCINEX DM  Take 1.5 tablets by mouth every 12 (twelve) hours.     guaifenesin 100 MG/5ML syrup  Commonly known as:  ROBITUSSIN  Take 200 mg by mouth 3 (three) times daily as needed for cough.     ibuprofen 200 MG tablet  Commonly known as:  ADVIL,MOTRIN  Take 3 tablets (600 mg total) by mouth every 6 (six) hours as needed for pain.     metroNIDAZOLE 500 MG tablet  Commonly known as:  FLAGYL  Take 1 tablet (500 mg total)  by mouth every 8 (eight) hours.     sertraline 100 MG tablet  Commonly known as:  ZOLOFT  Take 200 mg by mouth daily.           Follow-up Information   Follow up with [facility] On 05/08/2012. (Arrive at 12:30 for 12:45 appt.)    Contact information:   [address]   [place] [address] [phone]       Signed: Adolph Pollack 04/25/2012, 7:50 AM

## 2012-05-08 ENCOUNTER — Encounter (INDEPENDENT_AMBULATORY_CARE_PROVIDER_SITE_OTHER): Payer: Self-pay

## 2014-09-22 IMAGING — CT CT ABD-PELV W/ CM
2 of 5 series · 15 of 46 positions shown, 17 images · IV contrast (omnipaque)
Comparison: None.

CLINICAL DATA: Right lower quadrant pain, rule out appendicitis

CT ABDOMEN AND PELVIS WITH CONTRAST
TECHNIQUE: Multidetector CT imaging of the abdomen and pelvis was
performed following the standard protocol during bolus
administration of intravenous contrast.
Contrast: 50mL OMNIPAQUE IOHEXOL 300 MG/ML  SOLN, 100mL OMNIPAQUE
IOHEXOL 300 MG/ML  SOLN

[Series 2: abd/pelvis 5.0 b31f · axial · 0.71mm/px · z∈[+649,+1019]mm · 12 of 84 slices shown, 14 images]
[im 5/84  soft-tissue]
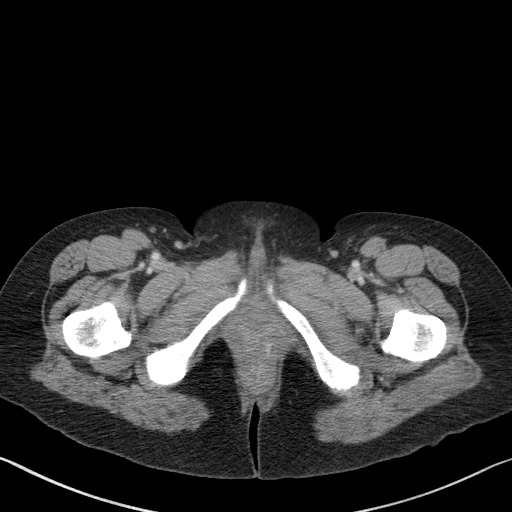
[im 5/84  bone]
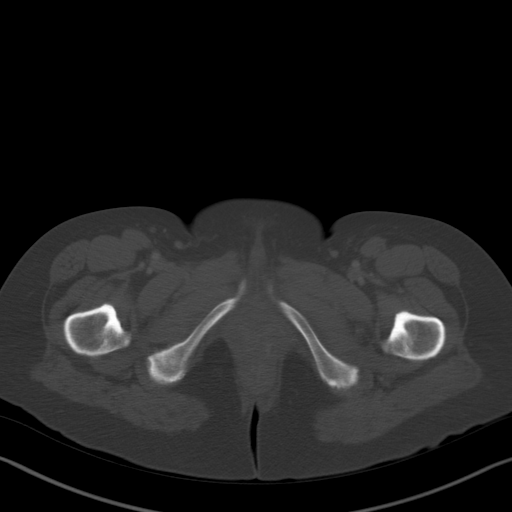
[im 13/84  soft-tissue]
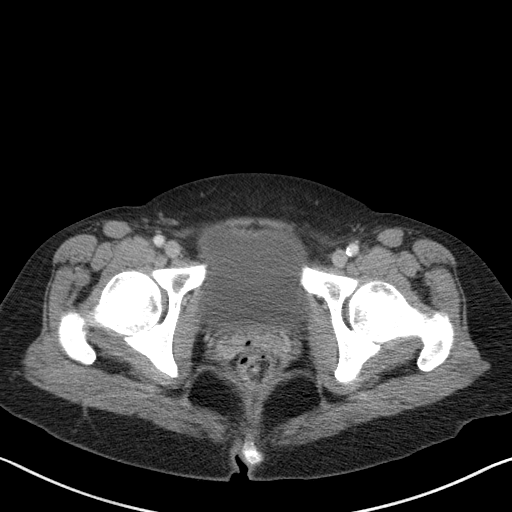
[im 17/84  soft-tissue]
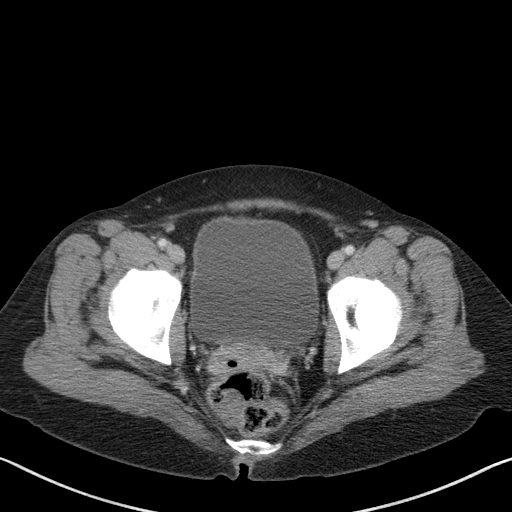
[im 25/84  soft-tissue]
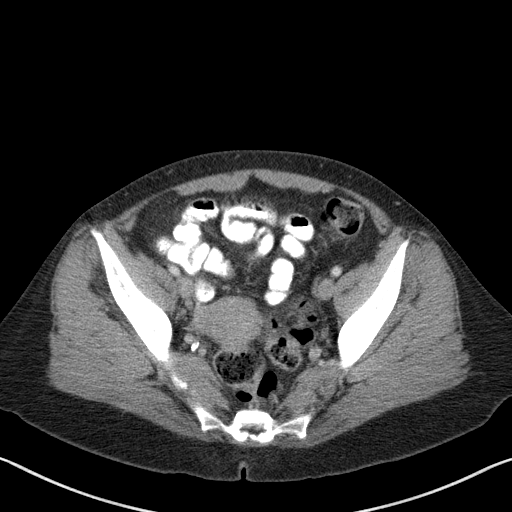
[im 34/84  soft-tissue]
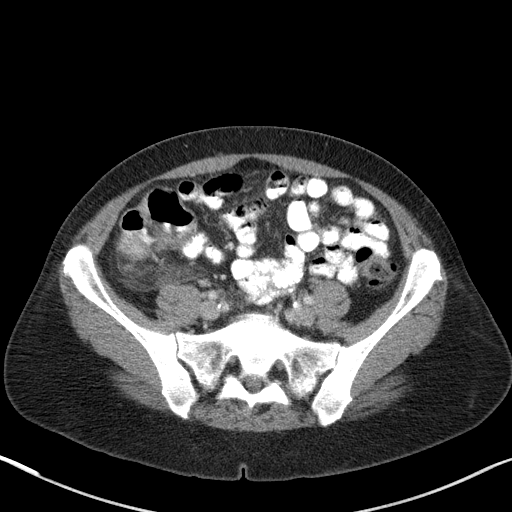
[im 38/84  soft-tissue]
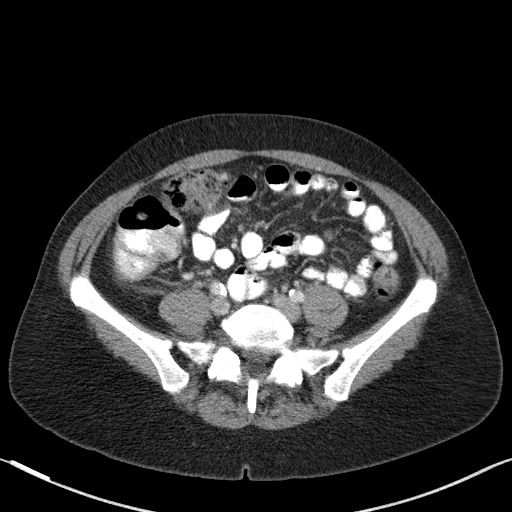
[im 46/84  soft-tissue]
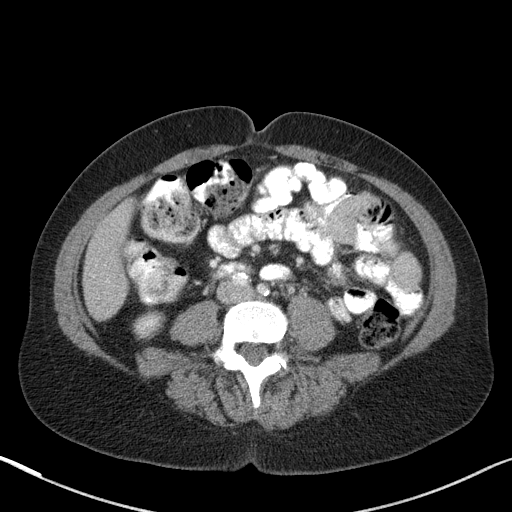
[im 50/84  soft-tissue]
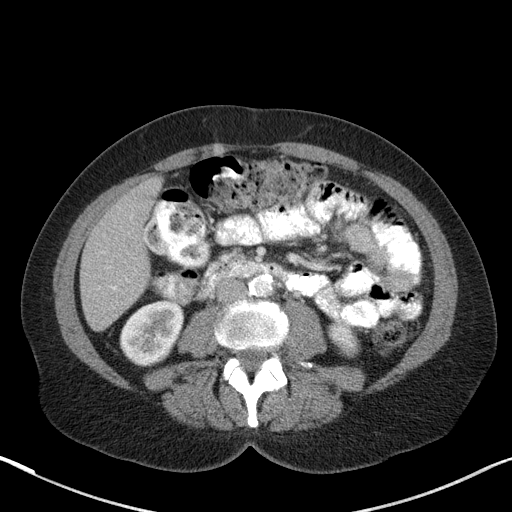
[im 59/84  soft-tissue]
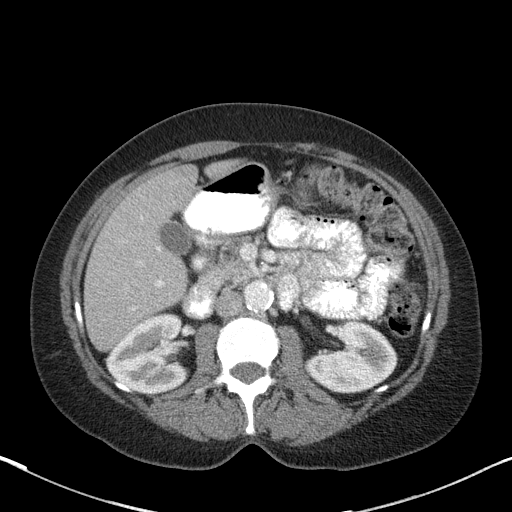
[im 59/84  bone]
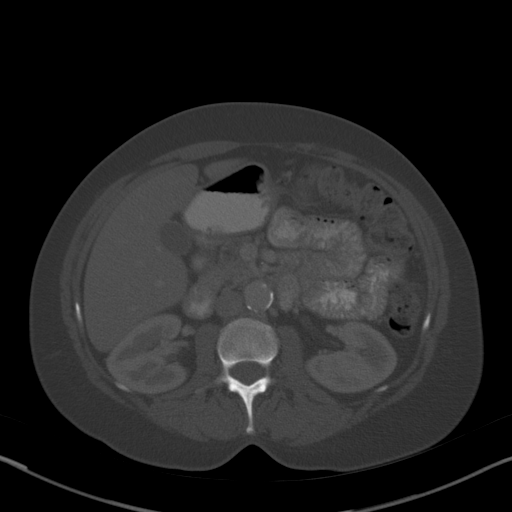
[im 67/84  soft-tissue]
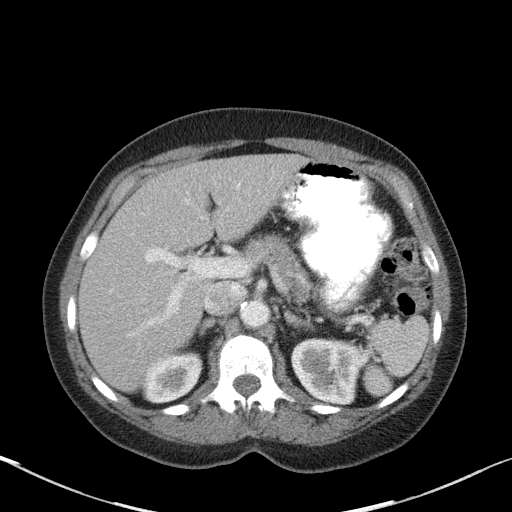
[im 71/84  soft-tissue]
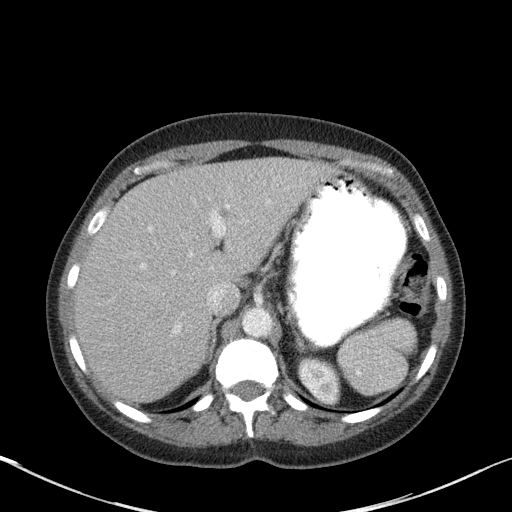
[im 79/84  soft-tissue]
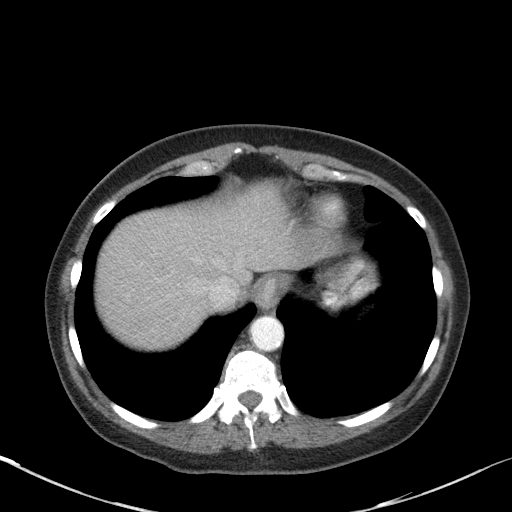

[Series 5: abd/pelvis 3.0 coronal · coronal · 0.84mm/px · 3 of 72 slices shown]
[im 24/72  soft-tissue]
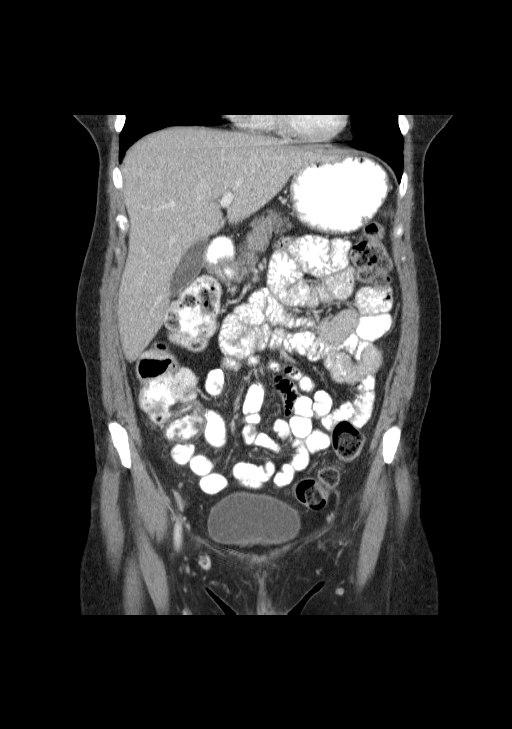
[im 32/72  soft-tissue]
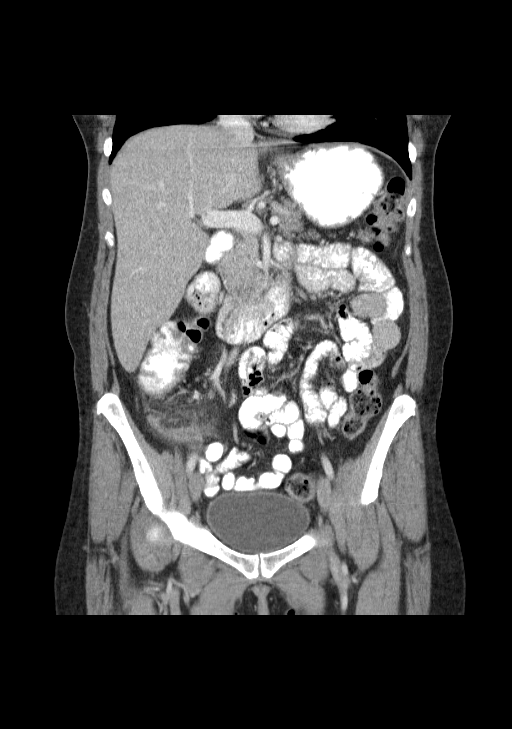
[im 40/72  soft-tissue]
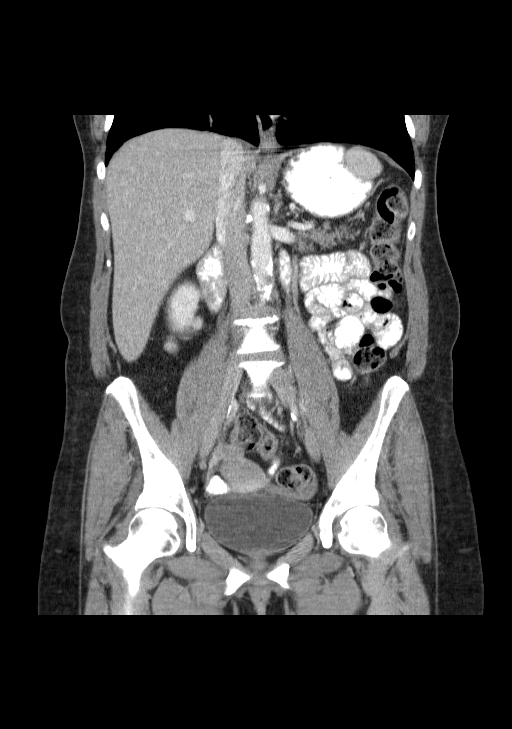

[15 of 46 positions shown; findings below may reference images not displayed]

FINDINGS: Lung bases are unremarkable.  Sagittal images of the
spine shows mild generative changes lumbar spine.

Small hiatal hernia is noted.  Enhanced liver is unremarkable.  No
calcified gallstones are noted within gallbladder.  Atherosclerotic
calcifications of the abdominal aorta and the iliac arteries are
noted.

No aortic aneurysm.  The pancreas, spleen and adrenal glands are
unremarkable.  Kidneys are symmetrical in size and enhancement.  No
focal renal mass.  No hydronephrosis or hydroureter.

Delayed renal images shows bilateral renal symmetrical excretion.
Bilateral visualized proximal ureter is unremarkable.

Moderate stool throughout the colon.  No small bowel obstruction.

There is abnormal thickened appendix with enhancement of the wall.
There is thickening of the appendiceal wall.  There is stranding of
the surrounding fat.  Findings are consistent with acute
appendicitis.  The appendix measures 1.2 cm in diameter.

No appendiceal abscess is noted.

The uterus and adnexa are unremarkable.  The urinary bladder is
unremarkable.

No destructive bony lesions are noted within pelvis.  No inguinal
adenopathy.
IMPRESSION: 1. There is abnormal appendix in the right lower quadrant of the
abdomen consistent with acute appendicitis.  No appendiceal abscess
is noted.
2.  No small bowel obstruction.
3.  No hydronephrosis or hydroureter.
4.  Moderate stool throughout the colon.

Critical findings discussed with Dr. Yuno Jopa

## 2022-09-05 ENCOUNTER — Other Ambulatory Visit: Payer: Self-pay

## 2022-09-05 ENCOUNTER — Emergency Department (HOSPITAL_BASED_OUTPATIENT_CLINIC_OR_DEPARTMENT_OTHER)
Admission: EM | Admit: 2022-09-05 | Discharge: 2022-09-05 | Disposition: A | Discharge status: Home / Self Care | Payer: Medicaid Other | Attending: Emergency Medicine | Admitting: Emergency Medicine

## 2022-09-05 ENCOUNTER — Encounter (HOSPITAL_BASED_OUTPATIENT_CLINIC_OR_DEPARTMENT_OTHER): Payer: Self-pay

## 2022-09-05 DIAGNOSIS — R1084 Generalized abdominal pain: Secondary | ICD-10-CM | POA: Insufficient documentation

## 2022-09-05 LAB — COMPREHENSIVE METABOLIC PANEL
ALT: 15 U/L (ref 0–44)
AST: 20 U/L (ref 15–41)
Albumin: 3.8 g/dL (ref 3.5–5.0)
Alkaline Phosphatase: 97 U/L (ref 38–126)
Anion gap: 11 (ref 5–15)
BUN: 7 mg/dL — ABNORMAL LOW (ref 8–23)
CO2: 23 mmol/L (ref 22–32)
Calcium: 9 mg/dL (ref 8.9–10.3)
Chloride: 101 mmol/L (ref 98–111)
Creatinine, Ser: 0.59 mg/dL (ref 0.44–1.00)
GFR, Estimated: >60 mL/min (ref >60)
Glucose, Bld: 119 mg/dL — ABNORMAL HIGH (ref 70–99)
Potassium: 3.6 mmol/L (ref 3.5–5.1)
Sodium: 135 mmol/L (ref 135–145)
Total Bilirubin: 0.6 mg/dL (ref 0.3–1.2)
Total Protein: 6.9 g/dL (ref 6.5–8.1)

## 2022-09-05 LAB — CBC
HCT: 37.6 % (ref 36.0–46.0)
Hemoglobin: 12.5 g/dL (ref 12.0–15.0)
MCH: 30.3 pg (ref 26.0–34.0)
MCHC: 33.2 g/dL (ref 30.0–36.0)
MCV: 91 fL (ref 80.0–100.0)
Platelets: 273 10*3/uL (ref 150–400)
RBC: 4.13 MIL/uL (ref 3.87–5.11)
RDW: 14 % (ref 11.5–15.5)
WBC: 7 10*3/uL (ref 4.0–10.5)
nRBC: 0 % (ref 0.0–0.2)

## 2022-09-05 LAB — URINALYSIS, ROUTINE W REFLEX MICROSCOPIC
Bilirubin Urine: NEGATIVE
Glucose, UA: >=500 mg/dL — AB
Ketones, ur: NEGATIVE mg/dL
Leukocytes,Ua: NEGATIVE
Nitrite: NEGATIVE
Protein, ur: NEGATIVE mg/dL
Specific Gravity, Urine: 1.015 (ref 1.005–1.030)
pH: 5.5 (ref 5.0–8.0)

## 2022-09-05 LAB — URINALYSIS, MICROSCOPIC (REFLEX)

## 2022-09-05 LAB — LIPASE, BLOOD: Lipase: 24 U/L (ref 11–51)

## 2022-09-05 NOTE — ED Provider Notes (Signed)
[facility] Provider Note   CSN: [id number] Arrival date & time: 09/05/22  1442     History  Chief Complaint  Patient presents with   Abdominal Pain    Laurie Browning is a 62 y.o. female.   Abdominal Pain  Patient is a 62 year old female presented emergency room today with complaints of 6 months of constipation.  She states that she seems to only poop when she uses a over-the-counter herbal supplement called Swiss Kriss which seems to have fiber as well as some herbal supplements.  She states that she has had no nausea or vomiting.  She states she has diffuse abdominal cramping.  She states that the abdominal cramping started yesterday morning but she has had some issues with this intermittently for the past 6 months.  She denies any blood in her stool no fevers or chills no urinary frequency urgency dysuria or hematuria.  No chest pain or difficulty breathing  She denies any vaginal discharge     Home Medications Prior to Admission medications   Medication Sig Start Date End Date Taking? Authorizing Provider  amoxicillin (AMOXIL) 500 MG capsule Take 1,000 mg by mouth daily.    [provider]  ARIPiprazole (ABILIFY) 10 MG tablet Take 10 mg by mouth daily.    [provider]  dextromethorphan-guaiFENesin (MUCINEX DM) 30-600 MG per 12 hr tablet Take 1.5 tablets by mouth every 12 (twelve) hours.    [provider]  guaifenesin (ROBITUSSIN) 100 MG/5ML syrup Take 200 mg by mouth 3 (three) times daily as needed for cough.    [provider]  ibuprofen (ADVIL,MOTRIN) 200 MG tablet Take 3 tablets (600 mg total) by mouth every 6 (six) hours as needed for pain. 04/23/12   Avel Peace, MD  metroNIDAZOLE (FLAGYL) 500 MG tablet Take 1 tablet (500 mg total) by mouth every 8 (eight) hours. 04/23/12   Avel Peace, MD  sertraline (ZOLOFT) 100 MG tablet Take 200 mg by mouth daily.    [provider]       Allergies    Patient has no known allergies.    Review of Systems   Review of Systems  Gastrointestinal:  Positive for abdominal pain.    Physical Exam Updated Vital Signs BP (!) 147/92   Pulse 81   Temp 98 F (36.7 C) (Oral)   Resp 18   Ht 5' 2.5" (1.588 m)   Wt 77.1 kg   SpO2 98%   BMI 30.60 kg/m  Physical Exam Vitals and nursing note reviewed.  Constitutional:      General: She is not in acute distress.    Comments: Pleasant well-appearing 62 year old.  In no acute distress.  Sitting comfortably in bed.  Able answer questions appropriately follow commands. No increased work of breathing. Speaking in full sentences.   HENT:     Head: Normocephalic and atraumatic.     Nose: Nose normal.  Eyes:     General: No scleral icterus. Cardiovascular:     Rate and Rhythm: Normal rate and regular rhythm.     Pulses: Normal pulses.     Heart sounds: Normal heart sounds.  Pulmonary:     Effort: Pulmonary effort is normal. No respiratory distress.     Breath sounds: No wheezing.  Abdominal:     Palpations: Abdomen is soft.     Tenderness: There is no abdominal tenderness.     Comments: Abdomen is soft nontender.  No guarding or rebound.  Musculoskeletal:     Cervical back: Normal range of motion.     Right lower leg: No edema.     Left lower leg: No edema.  Skin:    General: Skin is warm and dry.     Capillary Refill: Capillary refill takes less than 2 seconds.  Neurological:     Mental Status: She is alert. Mental status is at baseline.  Psychiatric:        Mood and Affect: Mood normal.        Behavior: Behavior normal.     ED Results / Procedures / Treatments   Labs (all labs ordered are listed, but only abnormal results are displayed) Labs Reviewed  COMPREHENSIVE METABOLIC PANEL - Abnormal; Notable for the following components:      Result Value   Glucose, Bld 119 (*)    BUN 7 (*)    All other components within normal limits  URINALYSIS, ROUTINE W REFLEX  MICROSCOPIC - Abnormal; Notable for the following components:   Glucose, UA >=500 (*)    Hgb urine dipstick SMALL (*)    All other components within normal limits  URINALYSIS, MICROSCOPIC (REFLEX) - Abnormal; Notable for the following components:   Bacteria, UA RARE (*)    All other components within normal limits  LIPASE, BLOOD  CBC    EKG None  Radiology No results found.  Procedures Procedures    Medications Ordered in ED Medications - No data to display  ED Course/ Medical Decision Making/ A&P Clinical Course as of 09/05/22 1925  Mon Sep 05, 2022  1731 Abd soreness started yesterday. It's been constant but mild. She's had some constipation 6 months.  [WF]  1755 6 months of constipation symptoms.  States that she has had to use Swiss [employer] which is a fiber containing supplement that she has been using.  She has been using it only when needed.  She is not taking any medications daily for constipation. [WF]    Clinical Course User Index [WF] Gailen Shelter, [place]                             Medical Decision Making Amount and/or Complexity of Data Reviewed Labs: ordered.   This patient presents to the ED for concern of abd pain, this involves a number of treatment options, and is a complaint that carries with it a moderate risk of complications and morbidity. A differential diagnosis was considered for the patient's symptoms which is discussed below:   The causes of generalized abdominal pain include but are not limited to AAA, mesenteric ischemia, appendicitis, diverticulitis, DKA, gastritis, gastroenteritis, AMI, nephrolithiasis, pancreatitis, peritonitis, adrenal insufficiency,lead poisoning, iron toxicity, intestinal ischemia, constipation, UTI,SBO/LBO, splenic rupture, biliary disease, IBD, IBS, PUD, or hepatitis. Ectopic pregnancy, ovarian torsion, PID.    Co morbidities: Discussed in HPI   Brief History:  Patient is a 62 year old female presented emergency  room today with complaints of 6 months of constipation.  She states that she seems to only poop when she uses a over-the-counter herbal supplement called Swiss Kriss which seems to have fiber as well as some herbal supplements.  She states that she has had no nausea or vomiting.  She states she has diffuse abdominal cramping.  She states that the abdominal cramping started yesterday morning but she has had some issues with this intermittently for the past 6 months.  She denies any blood in her stool no fevers or chills  no urinary frequency urgency dysuria or hematuria.  No chest pain or difficulty breathing  She denies any vaginal discharge    EMR reviewed including pt PMHx, past surgical history and past visits to ER.   See HPI for more details   Lab Tests:   I personally reviewed all laboratory work and imaging. Metabolic panel without any acute abnormality specifically kidney function within normal limits and no significant electrolyte abnormalities. CBC without leukocytosis or significant anemia. Lipase normal, urinalysis unremarkable apart from small hematuria  Imaging Studies:  No imaging studies ordered for this patient    Cardiac Monitoring:  The patient was maintained on a cardiac monitor.  I personally viewed and interpreted the cardiac monitored which showed an underlying rhythm of: NSR NA   Medicines ordered:     Critical Interventions:     Consults/Attending Physician      Reevaluation:  After the interventions noted above I re-evaluated patient and found that they have :stayed the same   Social Determinants of Health:      Problem List / ED Course:  Abdominal pain  --  with 6 months of some crampy abdominal pain and some constipation.  She had a MRI of her abdomen done within the past 2 months that showed a small mass on adrenal gland but no other abnormal findings.  This was done at an outpatient facility and I am unable to view these images.   I offered patient CT imaging of abdomen pelvis however I did discuss with her the pros and cons of this and the fact that I suspect her symptoms are more likely related to constipation.  After shared decision-making rotation she states she prefer to hold off on the CT scan and will hydrate and take fiber at home. Constipation --patient drinks perhaps 1 glass of water a day, drinks sweet tea, does not take fiber regularly.  Recently retired and has not been as active recently over the past 6 - 8 months.   Dispostion:  After consideration of the diagnostic results and the patients response to treatment, I feel that the patent would benefit from fiber, hydration, walking, follow-up with primary care.    Final Clinical Impression(s) / ED Diagnoses Final diagnoses:  Generalized abdominal pain    Rx / DC Orders ED Discharge Orders     None         Gailen Shelter, [place] 09/05/22 1930    Derwood Kaplan, MD 09/06/22 1510

## 2022-09-05 NOTE — Discharge Instructions (Signed)
I reviewed your MRI from a month ago and I feel quite reassured overall about your symptoms being most likely related to constipation.  Please read the information about bowel regimens below.  Make sure you are drinking more water as discussed, walking at least 1 mile a day, and taking MiraLAX 3 times daily once of each meal as we discussed.  Please return the emergency room for any fevers, worsening pain, any other new or concerning symptoms as you needed.  GETTING TO GOOD BOWEL HEALTH. Irregular bowel habits such as constipation and diarrhea can lead to many problems over time.  Having one soft bowel movement a day is the most important way to prevent further problems.  The anorectal canal is designed to handle stretching and feces to safely manage our ability to get rid of solid waste (feces, poop, stool) out of our body.  BUT, hard constipated stools can act like ripping concrete bricks and diarrhea can be a burning fire to this very sensitive area of our body, causing inflamed hemorrhoids, anal fissures, increasing risk is perirectal abscesses, abdominal pain/bloating, an making irritable bowel worse.     The goal: ONE SOFT BOWEL MOVEMENT A DAY!  To have soft, regular bowel movements:  Drink at least 8 tall glasses of water a day.   Take plenty of fiber.  Fiber is the undigested part of plant food that passes into the colon, acting s "natures broom" to encourage bowel motility and movement.  Fiber can absorb and hold large amounts of water. This results in a larger, bulkier stool, which is soft and easier to pass. Work gradually over several weeks up to 6 servings a day of fiber (25g a day even more if needed) in the form of: Vegetables -- Root (potatoes, carrots, turnips), leafy green (lettuce, salad greens, celery, spinach), or cooked high residue (cabbage, broccoli, etc) Fruit -- Fresh (unpeeled skin & pulp), Dried (prunes, apricots, cherries, etc ),  or stewed ( applesauce)  Whole grain breads,  pasta, etc (whole wheat)  Bran cereals  Bulking Agents -- This type of water-retaining fiber generally is easily obtained each day by one of the following:  Psyllium bran -- The psyllium plant is remarkable because its ground seeds can retain so much water. This product is available as Metamucil, Konsyl, Effersyllium, Per Diem Fiber, or the less expensive generic preparation in drug and health food stores. Although labeled a laxative, it really is not a laxative.  Methylcellulose -- This is another fiber derived from wood which also retains water. It is available as Citrucel. Polyethylene Glycol - and "artificial" fiber commonly called Miralax or Glycolax.  It is helpful for people with gassy or bloated feelings with regular fiber Flax Seed - a less gassy fiber than psyllium No reading or other relaxing activity while on the toilet. If bowel movements take longer than 5 minutes, you are too constipated AVOID CONSTIPATION.  High fiber and water intake usually takes care of this.  Sometimes a laxative is needed to stimulate more frequent bowel movements, but  Laxatives are not a good long-term solution as it can wear the colon out. Osmotics (Milk of Magnesia, Fleets phosphosoda, Magnesium citrate, MiraLax, GoLytely) are safer than  Stimulants (Senokot, Castor Oil, Dulcolax, Ex Lax)    Do not take laxatives for more than 7days in a row.  IF SEVERELY CONSTIPATED, try a Bowel Retraining Program: Do not use laxatives.  Eat a diet high in roughage, such as bran cereals and leafy vegetables.  Drink six (6) ounces of prune or apricot juice each morning.  Eat two (2) large servings of stewed fruit each day.  Take one (1) heaping tablespoon of a psyllium-based bulking agent twice a day. Use sugar-free sweetener when possible to avoid excessive calories.  Eat a normal breakfast.  Set aside 15 minutes after breakfast to sit on the toilet, but do not strain to have a bowel movement.  If you do not have a  bowel movement by the third day, use an enema and repeat the above steps.  Controlling diarrhea Switch to liquids and simpler foods for a few days to avoid stressing your intestines further. Avoid dairy products (especially milk & ice cream) for a short time.  The intestines often can lose the ability to digest lactose when stressed. Avoid foods that cause gassiness or bloating.  Typical foods include beans and other legumes, cabbage, broccoli, and dairy foods.  Every person has some sensitivity to other foods, so listen to our body and avoid those foods that trigger problems for you. Adding fiber (Citrucel, Metamucil, psyllium, Miralax) gradually can help thicken stools by absorbing excess fluid and retrain the intestines to act more normally.  Slowly increase the dose over a few weeks.  Too much fiber too soon can backfire and cause cramping & bloating. Probiotics (such as active yogurt, Align, etc) may help repopulate the intestines and colon with normal bacteria and calm down a sensitive digestive tract.  Most studies show it to be of mild help, though, and such products can be costly. Medicines: Bismuth subsalicylate (ex. Kayopectate, Pepto Bismol) every 30 minutes for up to 6 doses can help control diarrhea.  Avoid if pregnant. Loperamide (Immodium) can slow down diarrhea.  Start with two tablets (4mg  total) first and then try one tablet every 6 hours.  Avoid if you are having fevers or severe pain.  If you are not better or start feeling worse, stop all medicines and call your doctor for advice Call your doctor if you are getting worse or not better.  Sometimes further testing (cultures, endoscopy, X-ray studies, bloodwork, etc) may be needed to help diagnose and treat the cause of the diarrhea.  Managing Pain  Pain after surgery or related to activity is often due to strain/injury to muscle, tendon, nerves and/or incisions.  This pain is usually short-term and will improve in a few months.    Many people find it helpful to do the following things TOGETHER to help speed the process of healing and to get back to regular activity more quickly:  Avoid heavy physical activity  no lifting greater than 20 pounds Do not "push through" the pain.  Listen to your body and avoid positions and maneuvers than reproduce the pain Walking is okay as tolerated, but go slowly and stop when getting sore.  Remember: If it hurts to do it, then don't do it! Take Anti-inflammatory medication  Take with food/snack around the clock for 1-2 weeks This helps the muscle and nerve tissues become less irritable and calm down faster Choose ONE of the following over-the-counter medications: Naproxen 220mg  tabs (ex. Aleve) 1-2 pills twice a day  Ibuprofen 200mg  tabs (ex. Advil, Motrin) 3-4 pills with every meal and just before bedtime Acetaminophen 500mg  tabs (Tylenol) 1-2 pills with every meal and just before bedtime Use a Heating pad or Ice/Cold Pack 4-6 times a day May use warm bath/hottub  or showers Try Gentle Massage and/or Stretching  at the area of pain many times  a day stop if you feel pain - do not overdo it  Try these steps together to help you body heal faster and avoid making things get worse.  Doing just one of these things may not be enough.    If you are not getting better after two weeks or are noticing you are getting worse, contact our office for further advice; we may need to re-evaluate you & see what other things we can do to help.

## 2022-09-05 NOTE — ED Triage Notes (Signed)
Pt reports sight sided abd pain and abdominal bloating. She reports she has been taking Facilities manager (a laxative from a health foods store) for the last 6 months to help her have a BM but it is not helping. Miralax also not helping. LBM 3 days ago.
